# Patient Record
Sex: Female | Born: 1995 | Race: White | Hispanic: No | Marital: Single | Smoking: Never smoker
Health system: Southern US, Community
[De-identification: ages and names within clinical notes are randomized; demographics above are authoritative.]

## PROBLEM LIST (undated history)

## (undated) DIAGNOSIS — F419 Anxiety disorder, unspecified: Secondary | ICD-10-CM

## (undated) DIAGNOSIS — F329 Major depressive disorder, single episode, unspecified: Secondary | ICD-10-CM

## (undated) DIAGNOSIS — J45909 Unspecified asthma, uncomplicated: Secondary | ICD-10-CM

## (undated) DIAGNOSIS — F32A Depression, unspecified: Secondary | ICD-10-CM

## (undated) HISTORY — PX: NASAL POLYP EXCISION: SHX2068

## (undated) HISTORY — PX: TYMPANOSTOMY TUBE PLACEMENT: SHX32

## (undated) HISTORY — PX: INDUCED ABORTION: SHX677

## (undated) HISTORY — PX: APPENDECTOMY: SHX54

---

## 2018-02-20 ENCOUNTER — Emergency Department
Admission: EM | Admit: 2018-02-20 | Discharge: 2018-02-21 | Disposition: A | Discharge status: Home / Self Care | Payer: 59 | Attending: Emergency Medicine | Admitting: Emergency Medicine

## 2018-02-20 ENCOUNTER — Encounter: Payer: Self-pay | Admitting: Emergency Medicine

## 2018-02-20 ENCOUNTER — Emergency Department: Payer: 59

## 2018-02-20 DIAGNOSIS — J4521 Mild intermittent asthma with (acute) exacerbation: Secondary | ICD-10-CM

## 2018-02-20 DIAGNOSIS — E876 Hypokalemia: Secondary | ICD-10-CM

## 2018-02-20 DIAGNOSIS — R0602 Shortness of breath: Secondary | ICD-10-CM | POA: Diagnosis present

## 2018-02-20 HISTORY — DX: Unspecified asthma, uncomplicated: J45.909

## 2018-02-20 HISTORY — DX: Depression, unspecified: F32.A

## 2018-02-20 HISTORY — DX: Major depressive disorder, single episode, unspecified: F32.9

## 2018-02-20 HISTORY — DX: Anxiety disorder, unspecified: F41.9

## 2018-02-20 NOTE — ED Triage Notes (Signed)
Pt comes itno the ED via [facility] from a friends house where she started getting short of breath.  Patient has h/o asthma and took 4 puffs of her rescue inhaler with no improvement.  Wheezing audible in chest at this time.  3 breathing treatments and 125 solumedrol given in route with EMS.  Vital signs stable at this time and patient in NAD with even and unlabored respirations.

## 2018-02-20 NOTE — ED Provider Notes (Addendum)
[facility] Emergency Department Provider Note   ____________________________________________   First MD Initiated Contact with Patient 02/20/18 2356     (approximate)  I have reviewed the triage vital signs and the nursing notes.   HISTORY  Chief Complaint Shortness of Breath    HPI Dawn Holden is a 22 y.o. female brought to the ED from home via EMS with a chief complaint of shortness of breath.  Patient has a history of asthma, never requiring hospitalization.  She was treated by her PCP 2 weeks ago with antibiotics for bronchitis.  Today she has had dry cough and progressive shortness of breath with wheezing.  Did not have access to her nebulizer machine so did not do any nebulizer treatments prior to arrival.  EMS gave 3 duo nebs and 125 mg IV soluMedrol en route with good relief of patient's symptoms.  Denies recent fever, chills, chest pain,, pain, nausea, vomiting, diarrhea.  Denies recent travel or trauma.   Past Medical History:  Diagnosis Date  . Anxiety   . Asthma   . Depression     There are no active problems to display for this patient.   Past Surgical History:  Procedure Laterality Date  . APPENDECTOMY    . INDUCED ABORTION    . NASAL POLYP EXCISION    . TYMPANOSTOMY TUBE PLACEMENT      Prior to Admission medications   Not on File    Allergies Aspirin  No family history on file.  Social History Social History   Tobacco Use  . Smoking status: Never Smoker  . Smokeless tobacco: Never Used  Substance Use Topics  . Alcohol use: No    Frequency: Never  . Drug use: No    Review of Systems  Constitutional: No fever/chills. Eyes: No visual changes. ENT: No sore throat. Cardiovascular: Denies chest pain. Respiratory: Positive for cough, wheezing and shortness of breath. Gastrointestinal: No abdominal pain.  No nausea, no vomiting.  No diarrhea.  No constipation. Genitourinary: Negative for dysuria. Musculoskeletal:  Negative for back pain. Skin: Negative for rash. Neurological: Negative for headaches, focal weakness or numbness.   ____________________________________________   PHYSICAL EXAM:  VITAL SIGNS: ED Triage Vitals [02/20/18 2318]  Enc Vitals Group     BP      Pulse Rate (!) 103     Resp 18     Temp 97.8 F (36.6 C)     Temp Source Oral     SpO2 98 %     Weight 142 lb (64.4 kg)     Height 5\' 2"  (1.575 m)     Head Circumference      Peak Flow      Pain Score      Pain Loc      Pain Edu?      Excl. in GC?     Constitutional: Alert and oriented. Well appearing and in no acute distress. Eyes: Conjunctivae are normal. PERRL. EOMI. Head: Atraumatic. Nose: No congestion/rhinnorhea. Mouth/Throat: Mucous membranes are moist.  Oropharynx non-erythematous. Neck: No stridor.  Supple neck without meningismus. Cardiovascular: Normal rate, regular rhythm. Grossly normal heart sounds.  Good peripheral circulation. Respiratory: Normal respiratory effort.  No retractions. Lungs CTAB with tiny wheeze in the right lung base. Gastrointestinal: Soft and nontender. No distention. No abdominal bruits. No CVA tenderness. Musculoskeletal: No lower extremity tenderness nor edema.  No joint effusions. Neurologic:  Normal speech and language. No gross focal neurologic deficits are appreciated. No gait instability. Skin:  Skin is warm, dry and intact. No rash noted. Psychiatric: Mood and affect are normal. Speech and behavior are normal.  ____________________________________________   LABS (all labs ordered are listed, but only abnormal results are displayed)  Labs Reviewed  BASIC METABOLIC PANEL - Abnormal; Notable for the following components:      Result Value   Potassium 3.1 (*)    CO2 19 (*)    All other components within normal limits  CBC - Abnormal; Notable for the following components:   RBC 5.57 (*)    HCT 48.1 (*)    All other components within normal limits    ____________________________________________  EKG  ED ECG REPORT I, SUNG,JADE J, the attending physician, personally viewed and interpreted this ECG.   Date: 03/07/2018 addendum for 02/20/2018 EKG  EKG Time: 2332  Rate: 88  Rhythm: normal EKG, normal sinus rhythm  Axis: Normal  Intervals:none  ST&T Change: Nonspecific  ____________________________________________  RADIOLOGY  ED MD interpretation: No acute cardiopulmonary process  Official radiology report(s): Dg Chest 2 View  Result Date: 02/20/2018 CLINICAL DATA:  Acute onset of shortness of breath.  Wheezing. EXAM: CHEST - 2 VIEW COMPARISON:  None. FINDINGS: The lungs are well-aerated and clear. There is no evidence of focal opacification, pleural effusion or pneumothorax. The heart is normal in size; the mediastinal contour is within normal limits. No acute osseous abnormalities are seen. IMPRESSION: No acute cardiopulmonary process seen. Electronically Signed   By: Roanna RaiderJeffery  Chang M.D.   On: 02/20/2018 23:56    ____________________________________________   PROCEDURES  Procedure(s) performed: None  Procedures  Critical Care performed: No  ____________________________________________   INITIAL IMPRESSION / ASSESSMENT AND PLAN / ED COURSE  As part of my medical decision making, I reviewed the following data within the electronic MEDICAL RECORD NUMBER History obtained from family, Nursing notes reviewed and incorporated, Radiograph reviewed and Notes from prior ED visits   22 year old female with mild asthma, never hospitalized, who presents with exacerbation.  Patient had 3 duo nebs and IV Solu-Medrol prior to arrival per EMS with good improvement in her symptoms.  Currently she is resting comfortably with room air saturations 98%.  Chest x-ray is negative for pneumonia.  Will continue to monitor and reassess.  Clinical Course as of Feb 22 111  Thu Feb 21, 2018  0105 Patient resting in no acute distress.  Eating a  candy bar.  Reexamined lungs which are now clear without wheezing.  Room air saturations 98%.  Will discharge home with prescription for prednisone burst, and patient will follow-up with her PCP next week.  Strict return precautions given.  Patient and all family members verbalize understanding and agree with plan of care.  [JS]    Clinical Course User Index [JS] Irean HongSung, Jade J, MD     ____________________________________________   FINAL CLINICAL IMPRESSION(S) / ED DIAGNOSES  Final diagnoses:  Mild intermittent asthma with exacerbation  Hypokalemia     ED Discharge Orders    None       Note:  This document was prepared using Dragon voice recognition software and may include unintentional dictation errors.    Irean HongSung, Jade J, MD 02/21/18 [id number]    Irean HongSung, Jade J, MD 03/07/18 [phone]

## 2018-02-20 NOTE — ED Notes (Signed)
Patient transported to X-ray 

## 2018-02-21 LAB — CBC
HCT: 48.1 % — ABNORMAL HIGH (ref 35.0–47.0)
HEMOGLOBIN: 15.7 g/dL (ref 12.0–16.0)
MCH: 28.2 pg (ref 26.0–34.0)
MCHC: 32.7 g/dL (ref 32.0–36.0)
MCV: 86.2 fL (ref 80.0–100.0)
Platelets: 232 10*3/uL (ref 150–440)
RBC: 5.57 MIL/uL — AB (ref 3.80–5.20)
RDW: 12.9 % (ref 11.5–14.5)
WBC: 9 10*3/uL (ref 3.6–11.0)

## 2018-02-21 LAB — BASIC METABOLIC PANEL
ANION GAP: 14 (ref 5–15)
BUN: 13 mg/dL (ref 6–20)
CALCIUM: 8.9 mg/dL (ref 8.9–10.3)
CO2: 19 mmol/L — ABNORMAL LOW (ref 22–32)
Chloride: 106 mmol/L (ref 101–111)
Creatinine, Ser: 0.86 mg/dL (ref 0.44–1.00)
GLUCOSE: 90 mg/dL (ref 65–99)
Potassium: 3.1 mmol/L — ABNORMAL LOW (ref 3.5–5.1)
Sodium: 139 mmol/L (ref 135–145)

## 2018-02-21 MED ORDER — POTASSIUM CHLORIDE CRYS ER 20 MEQ PO TBCR
40.0000 meq | EXTENDED_RELEASE_TABLET | Freq: Once | ORAL | Status: AC
  Administered 2018-02-21: 40 meq via ORAL
  Filled 2018-02-21: qty 2

## 2018-02-21 MED ORDER — POTASSIUM CHLORIDE CRYS ER 20 MEQ PO TBCR
EXTENDED_RELEASE_TABLET | ORAL | Status: AC
  Filled 2018-02-21: qty 1

## 2018-02-21 MED ORDER — PREDNISONE 20 MG PO TABS: ORAL_TABLET | ORAL | 0 refills | Status: AC

## 2018-02-21 NOTE — Discharge Instructions (Signed)
1.  Take prednisone 60 mg daily for the next 4 days. 2.  Use your albuterol inhaler 2 puffs every 4 hours as needed for wheezing. 3.  Return to the ER for worsening symptoms, persistent vomiting, difficulty breathing or other concerns.

## 2018-11-26 IMAGING — CR DG CHEST 2V
1 series · 2 of 2 positions shown · non-contrast
Comparison: None.

CLINICAL DATA: Acute onset of shortness of breath.  Wheezing.

EXAM:
CHEST - 2 VIEW

[Series 1: dg chest 2 view · 0.14mm/px · 2 of 2 slices shown]
[im 1/2]
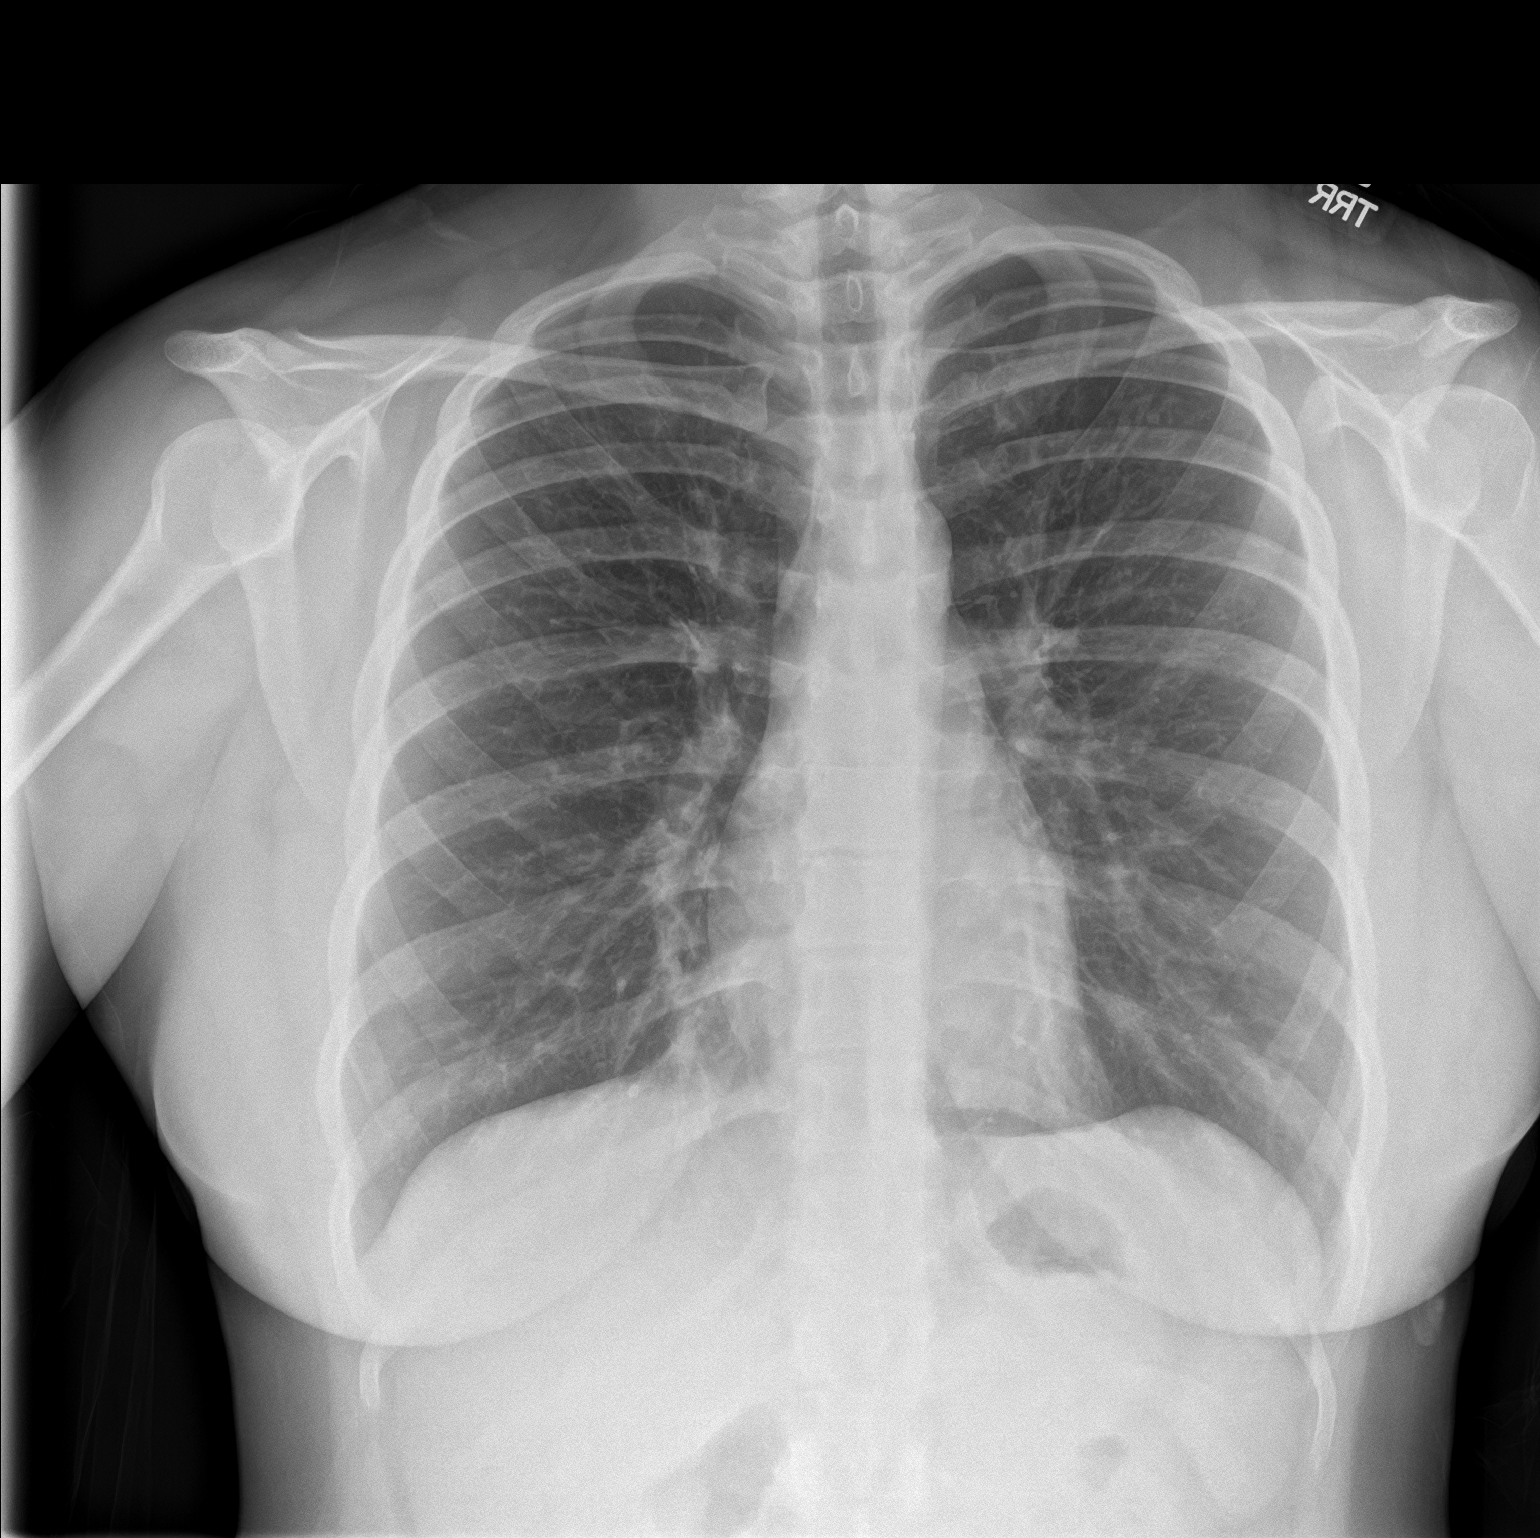
[im 2/2]
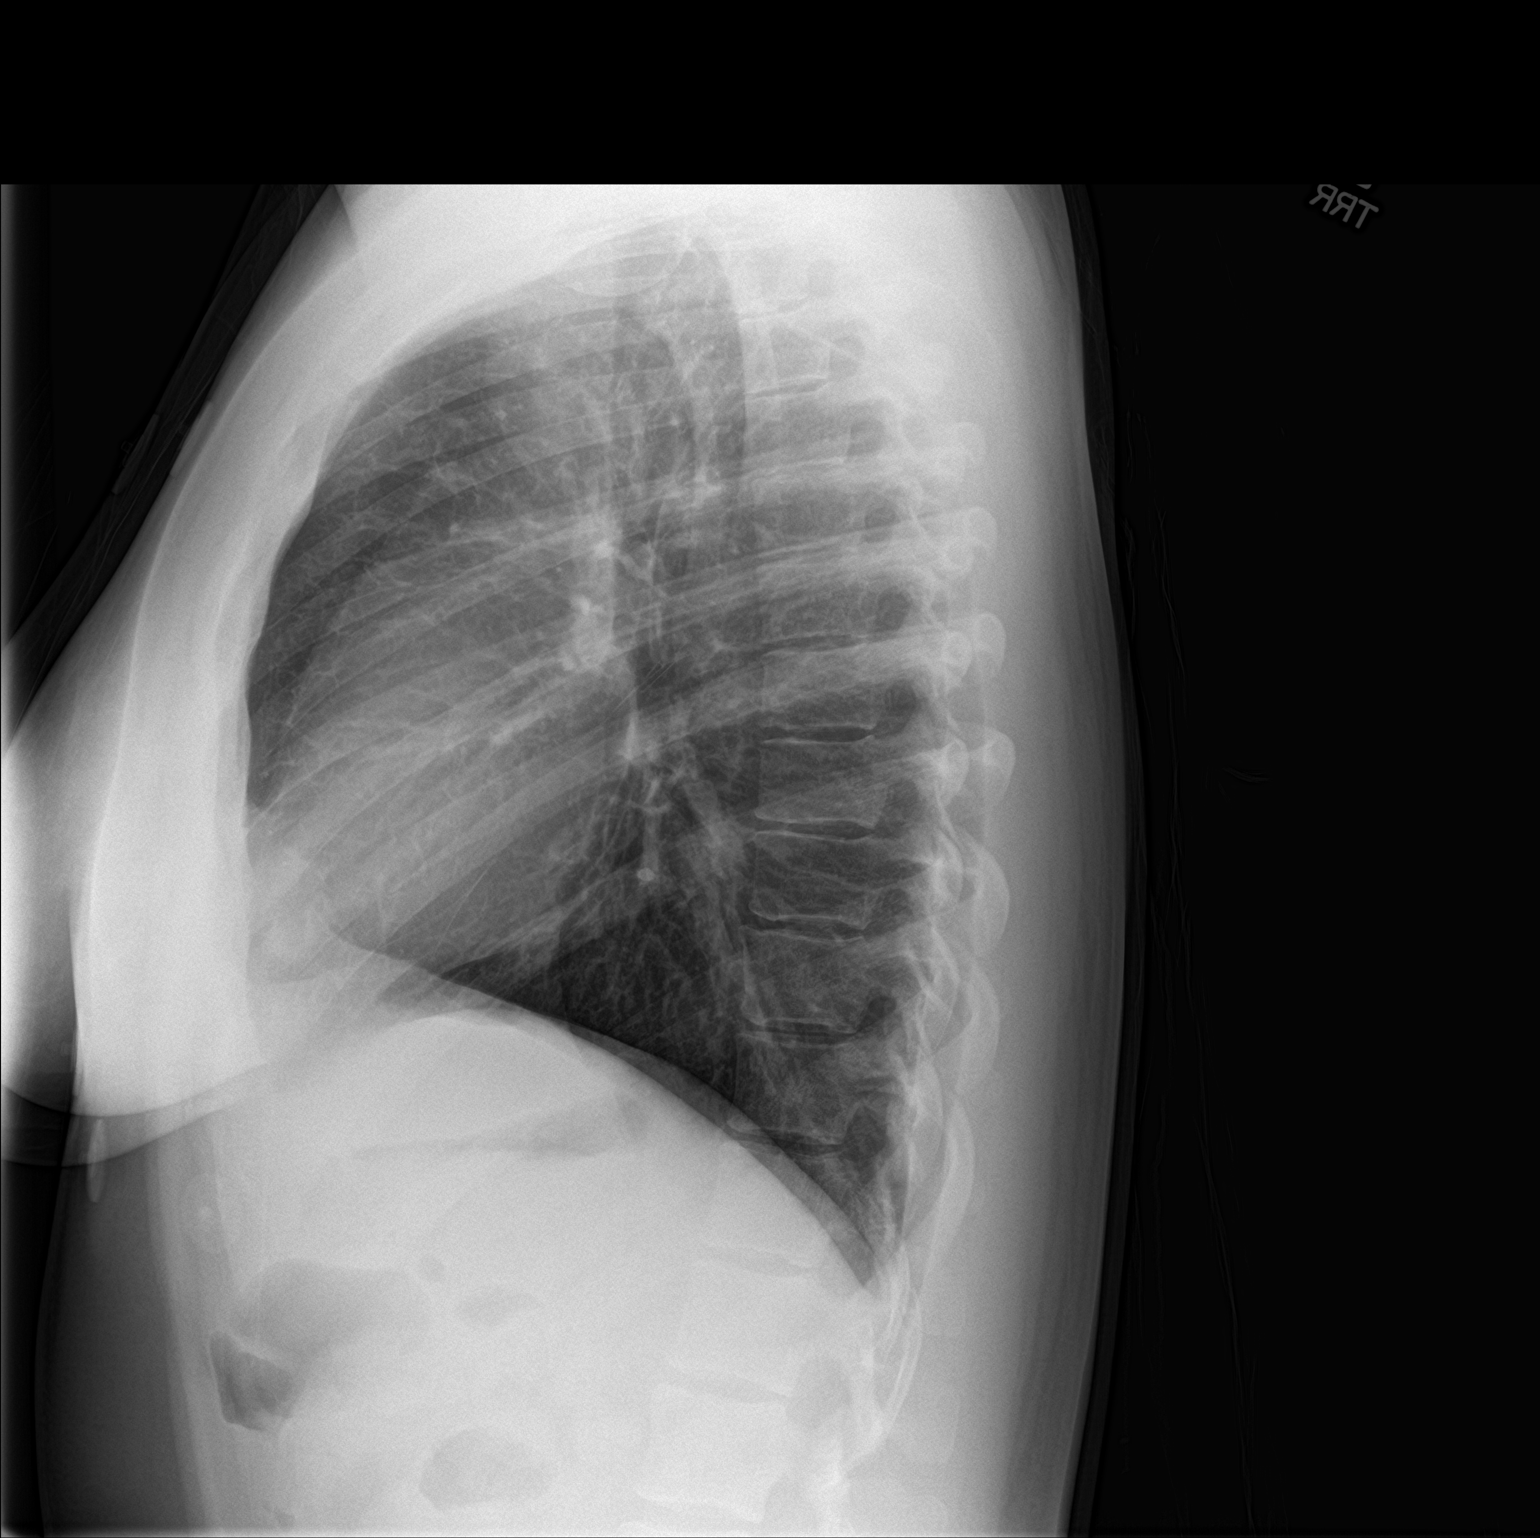

[2 of 2 positions shown; findings below may reference images not displayed]

FINDINGS: The lungs are well-aerated and clear. There is no evidence of focal
opacification, pleural effusion or pneumothorax.

The heart is normal in size; the mediastinal contour is within
normal limits. No acute osseous abnormalities are seen.
IMPRESSION: No acute cardiopulmonary process seen.
